# Patient Record
Sex: Male | Born: 1960 | Race: Black or African American | Hispanic: No | Smoking: Never smoker
Health system: Southern US, Community
[De-identification: ages and names within clinical notes are randomized; demographics above are authoritative.]

## PROBLEM LIST (undated history)

## (undated) DIAGNOSIS — Z21 Asymptomatic human immunodeficiency virus [HIV] infection status: Secondary | ICD-10-CM

## (undated) DIAGNOSIS — N289 Disorder of kidney and ureter, unspecified: Secondary | ICD-10-CM

## (undated) DIAGNOSIS — B20 Human immunodeficiency virus [HIV] disease: Secondary | ICD-10-CM

## (undated) DIAGNOSIS — Z992 Dependence on renal dialysis: Secondary | ICD-10-CM

## (undated) DIAGNOSIS — I10 Essential (primary) hypertension: Secondary | ICD-10-CM

## (undated) HISTORY — PX: OTHER SURGICAL HISTORY: SHX169

---

## 2004-12-15 ENCOUNTER — Emergency Department (HOSPITAL_COMMUNITY): Admission: EM | Admit: 2004-12-15 | Discharge: 2004-12-15 | Payer: Self-pay | Admitting: Emergency Medicine

## 2005-03-19 ENCOUNTER — Encounter: Admission: RE | Admit: 2005-03-19 | Discharge: 2005-03-19 | Payer: Self-pay | Admitting: General Surgery

## 2005-03-25 ENCOUNTER — Ambulatory Visit (HOSPITAL_COMMUNITY): Admission: RE | Admit: 2005-03-25 | Discharge: 2005-03-25 | Payer: Self-pay | Admitting: General Surgery

## 2005-03-25 ENCOUNTER — Ambulatory Visit (HOSPITAL_BASED_OUTPATIENT_CLINIC_OR_DEPARTMENT_OTHER): Admission: RE | Admit: 2005-03-25 | Discharge: 2005-03-25 | Payer: Self-pay | Admitting: General Surgery

## 2005-07-11 ENCOUNTER — Emergency Department (HOSPITAL_COMMUNITY): Admission: EM | Admit: 2005-07-11 | Discharge: 2005-07-12 | Payer: Self-pay | Admitting: Emergency Medicine

## 2005-07-15 ENCOUNTER — Emergency Department (HOSPITAL_COMMUNITY): Admission: EM | Admit: 2005-07-15 | Discharge: 2005-07-15 | Payer: Self-pay | Admitting: Emergency Medicine

## 2005-08-19 ENCOUNTER — Emergency Department (HOSPITAL_COMMUNITY): Admission: EM | Admit: 2005-08-19 | Discharge: 2005-08-19 | Payer: Self-pay | Admitting: Emergency Medicine

## 2005-11-15 IMAGING — CR DG CHEST 2V
2 series · 2 of 2 positions shown · non-contrast
Comparison: none

CLINICAL DATA: Preop respiratory exam for right inguinal herniorrhaphy.
 CHEST, TWO VIEWS ? 03/19/05:

[w chest pa]
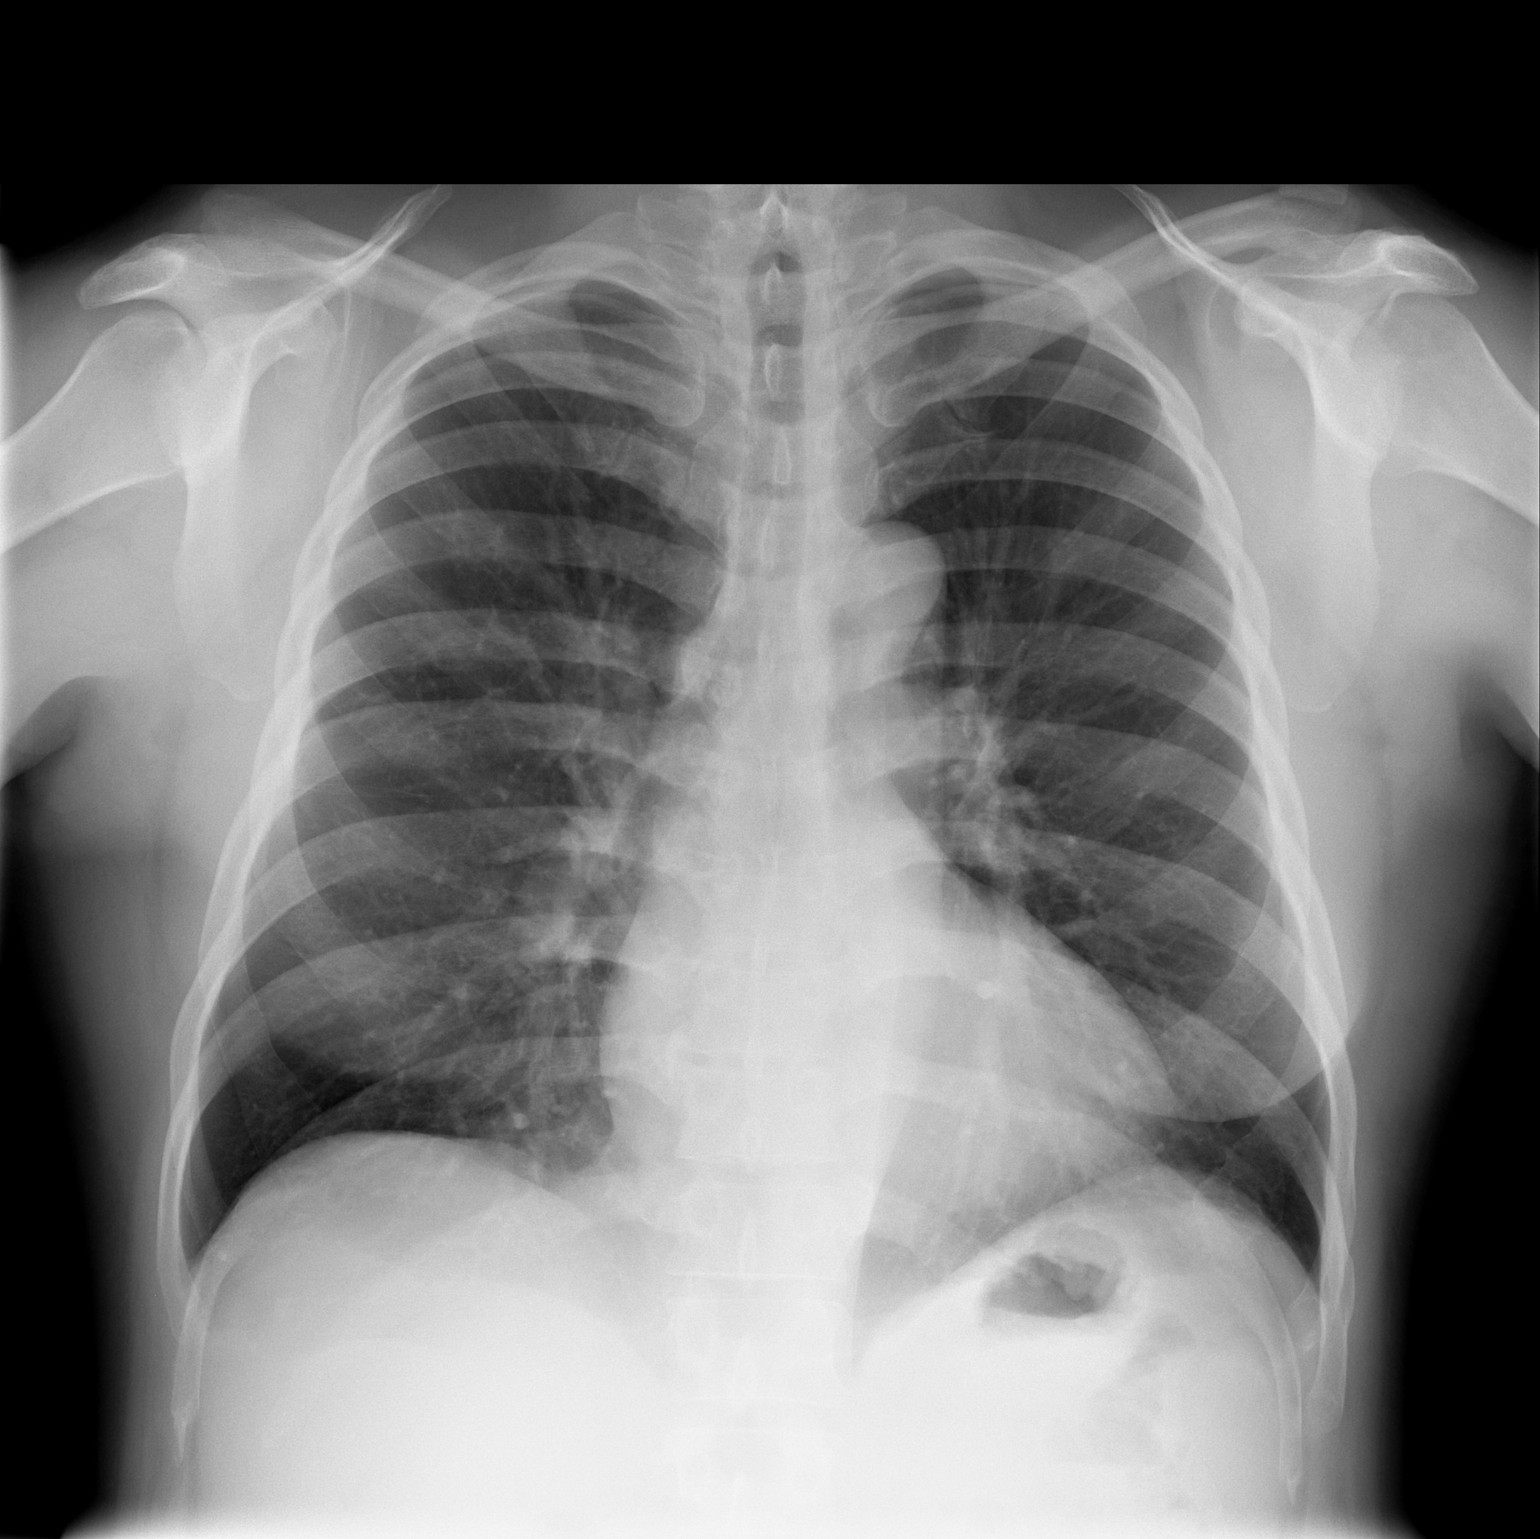

[w chest lat]
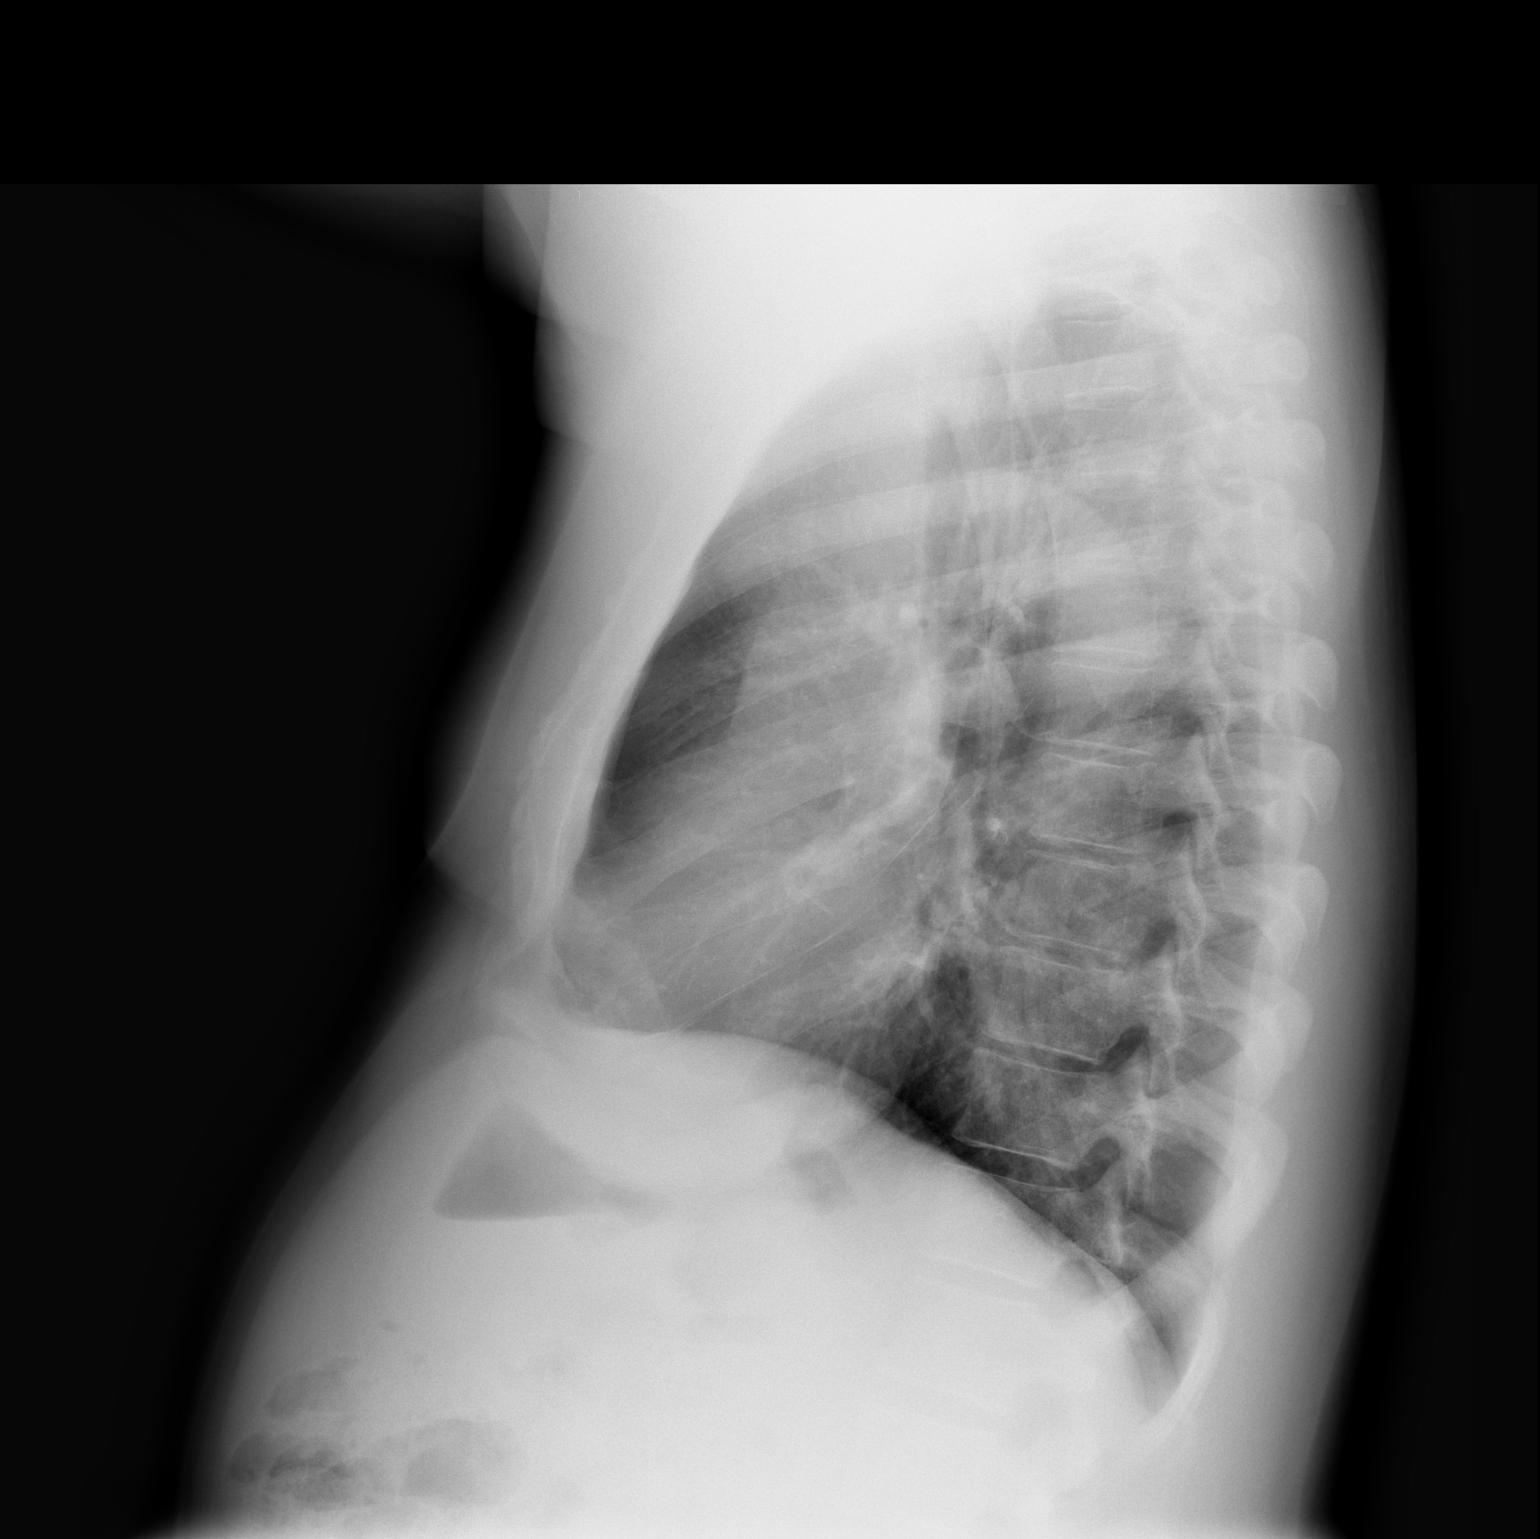

[2 of 2 positions shown; findings below may reference images not displayed]

FINDINGS: No comparison.  Slight cardiomegaly is seen.  Lungs are clear.  Mediastinum, hila, pleura, and osseous structures appear normal.
IMPRESSION: 1.  Slight cardiomegaly.
 2.  No active disease.

## 2015-02-03 ENCOUNTER — Emergency Department (HOSPITAL_BASED_OUTPATIENT_CLINIC_OR_DEPARTMENT_OTHER)
Admission: EM | Admit: 2015-02-03 | Discharge: 2015-02-03 | Disposition: A | Discharge status: Home / Self Care | Payer: Medicare Other | Attending: Emergency Medicine | Admitting: Emergency Medicine

## 2015-02-03 ENCOUNTER — Encounter (HOSPITAL_BASED_OUTPATIENT_CLINIC_OR_DEPARTMENT_OTHER): Payer: Self-pay

## 2015-02-03 DIAGNOSIS — Z21 Asymptomatic human immunodeficiency virus [HIV] infection status: Secondary | ICD-10-CM | POA: Diagnosis not present

## 2015-02-03 DIAGNOSIS — Y998 Other external cause status: Secondary | ICD-10-CM | POA: Insufficient documentation

## 2015-02-03 DIAGNOSIS — Y9289 Other specified places as the place of occurrence of the external cause: Secondary | ICD-10-CM | POA: Diagnosis not present

## 2015-02-03 DIAGNOSIS — S90112A Contusion of left great toe without damage to nail, initial encounter: Secondary | ICD-10-CM | POA: Diagnosis not present

## 2015-02-03 DIAGNOSIS — Z992 Dependence on renal dialysis: Secondary | ICD-10-CM | POA: Insufficient documentation

## 2015-02-03 DIAGNOSIS — N186 End stage renal disease: Secondary | ICD-10-CM | POA: Insufficient documentation

## 2015-02-03 DIAGNOSIS — W228XXA Striking against or struck by other objects, initial encounter: Secondary | ICD-10-CM | POA: Insufficient documentation

## 2015-02-03 DIAGNOSIS — Y9389 Activity, other specified: Secondary | ICD-10-CM | POA: Insufficient documentation

## 2015-02-03 DIAGNOSIS — I120 Hypertensive chronic kidney disease with stage 5 chronic kidney disease or end stage renal disease: Secondary | ICD-10-CM | POA: Diagnosis not present

## 2015-02-03 DIAGNOSIS — T148XXA Other injury of unspecified body region, initial encounter: Secondary | ICD-10-CM

## 2015-02-03 DIAGNOSIS — S99922A Unspecified injury of left foot, initial encounter: Secondary | ICD-10-CM | POA: Diagnosis present

## 2015-02-03 HISTORY — DX: Asymptomatic human immunodeficiency virus (hiv) infection status: Z21

## 2015-02-03 HISTORY — DX: Disorder of kidney and ureter, unspecified: N28.9

## 2015-02-03 HISTORY — DX: Essential (primary) hypertension: I10

## 2015-02-03 HISTORY — DX: Dependence on renal dialysis: Z99.2

## 2015-02-03 HISTORY — DX: Human immunodeficiency virus (HIV) disease: B20

## 2015-02-03 MED ORDER — HYDROCODONE-ACETAMINOPHEN 5-325 MG PO TABS: 1.0000 | ORAL_TABLET | ORAL | Status: AC | PRN

## 2015-02-03 NOTE — ED Notes (Signed)
Pt had left great toenail removed 3 weeks ago. Reports "growth" on toenail that is "throbbing"

## 2015-02-03 NOTE — ED Provider Notes (Signed)
CSN: [id number]     Arrival date & time 02/03/15  1439 History  This chart was scribed for Rolan BuccoMelanie Lance Huaracha, MD by Richarda Overlieichard Holland, ED Scribe. This patient was seen in room MH10/MH10 and the patient's care was started 3:14 PM.     Chief Complaint  Patient presents with  . Toe Pain   The history is provided by the patient. No language interpreter was used.   HPI Comments: Austin Thomas is a 54 y.o. male with a history of HTN, HIV, renal disorder and dialysis patient who presents to the Emergency Department complaining of left great toe pain for the last 3 days. Pt states he had his left great toenail removed 3 weeks ago and then stubbed this toe 3 days ago. He now complains of left great toe pain that he describes as throbbing and reports a "growth" at the tip of his toe. Pt reports that the area has been draining as well. He states he has been soaking his toe with some relief. Pt is asking to have the "growth" lanced at this time. Pt reports that walking aggravates his pain. Pt states that he recently ran out of his vicodin medication.   Past Medical History  Diagnosis Date  . Hypertension   . Renal disorder   . Dialysis patient   . HIV (human immunodeficiency virus infection)    Past Surgical History  Procedure Laterality Date  . Graft     No family history on file. History  Substance Use Topics  . Smoking status: Never Smoker   . Smokeless tobacco: Not on file  . Alcohol Use: No    Review of Systems  Musculoskeletal: Positive for arthralgias.  Skin: Positive for wound.  All other systems reviewed and are negative.  Allergies  Coumadin and Sulfa antibiotics  Home Medications   Prior to Admission medications   Medication Sig Start Date End Date Taking? Authorizing Provider  HYDROcodone-acetaminophen (NORCO/VICODIN) 5-325 MG per tablet Take 1-2 tablets by mouth every 4 (four) hours as needed. 02/03/15   Rolan BuccoMelanie Iviana Blasingame, MD   BP 120/66 mmHg  Pulse 80  Temp(Src) 99.2 F (37.3  C) (Oral)  Resp 16  Wt 205 lb (92.987 kg)  SpO2 100%   Physical Exam  Constitutional: He is oriented to person, place, and time. He appears well-developed and well-nourished.  HENT:  Head: Normocephalic and atraumatic.  Neck: Neck supple.  Cardiovascular: Normal rate.   Pulmonary/Chest: Effort normal. No respiratory distress.  Abdominal: He exhibits no distension.  Musculoskeletal:  Healing nailbed to left great toe where toenail has been removed.  Pink, exudative tissue, no drainage, swelling or signs of infection.  Small hematoma to tip of toe.  No redness or signs of infection.  No bony tenderness  Neurological: He is alert and oriented to person, place, and time.  Skin: Skin is warm and dry.  Psychiatric: He has a normal mood and affect. His behavior is normal.  Nursing note and vitals reviewed.   ED Course  Procedures   DIAGNOSTIC STUDIES: Oxygen Saturation is 100% on RA, normal by my interpretation.    COORDINATION OF CARE: 3:23 PM Discussed treatment plan with pt at bedside and pt agreed to plan.   Labs Review Labs Reviewed - No data to display  Imaging Review No results found.   EKG Interpretation None      MDM   Final diagnoses:  Hematoma   Pt with tenderness to tip of great toe with small hematoma.  I  don't see any signs of infection.  I advised him that it is not going to be beneficial to drain this and would put him at risk for developing infection to area.  Area was redressed and pt given a post op shoe.  Advised to soak area, pt given a refill on his vicodin, advised to return or f/u with his podiatrist if pain worsens or if he develops any signs of infection.  I have a low suspicion for fracture to toe and pt does not want to get an x-ray at this time.  I personally performed the services described in this documentation, which was scribed in my presence.  The recorded information has been reviewed and considered.      Rolan Bucco, MD 02/03/15  [phone]

## 2015-02-03 NOTE — Discharge Instructions (Signed)

## 2024-04-03 DEATH — deceased
